# Patient Record
Sex: Male | Born: 1985 | Race: Asian | Hispanic: No | Marital: Single | Smoking: Current every day smoker
Health system: Southern US, Community
[De-identification: ages and names within clinical notes are randomized; demographics above are authoritative.]

---

## 2011-03-20 ENCOUNTER — Inpatient Hospital Stay (INDEPENDENT_AMBULATORY_CARE_PROVIDER_SITE_OTHER)
Admission: RE | Admit: 2011-03-20 | Discharge: 2011-03-20 | Disposition: A | Discharge status: Home / Self Care | Payer: Self-pay | Source: Ambulatory Visit | Attending: Family Medicine | Admitting: Family Medicine

## 2011-03-20 DIAGNOSIS — L259 Unspecified contact dermatitis, unspecified cause: Secondary | ICD-10-CM

## 2011-06-24 ENCOUNTER — Emergency Department (HOSPITAL_COMMUNITY)
Admission: EM | Admit: 2011-06-24 | Discharge: 2011-06-24 | Disposition: A | Discharge status: Home / Self Care | Payer: Self-pay | Source: Home / Self Care

## 2012-11-11 ENCOUNTER — Encounter (HOSPITAL_COMMUNITY): Payer: Self-pay | Admitting: Emergency Medicine

## 2012-11-11 DIAGNOSIS — R51 Headache: Secondary | ICD-10-CM | POA: Insufficient documentation

## 2012-11-11 DIAGNOSIS — J029 Acute pharyngitis, unspecified: Secondary | ICD-10-CM | POA: Insufficient documentation

## 2012-11-11 NOTE — ED Notes (Signed)
PT. REPORTS SORE THROAT , HEADACHE , LOW GRADE FEVER AND FEET " COLD" ONSET THIS MORNING .

## 2012-11-12 ENCOUNTER — Emergency Department (HOSPITAL_COMMUNITY)
Admission: EM | Admit: 2012-11-12 | Discharge: 2012-11-12 | Disposition: A | Discharge status: Home / Self Care | Payer: PRIVATE HEALTH INSURANCE | Attending: Emergency Medicine | Admitting: Emergency Medicine

## 2012-11-12 DIAGNOSIS — J029 Acute pharyngitis, unspecified: Secondary | ICD-10-CM

## 2012-11-12 LAB — RAPID STREP SCREEN (MED CTR MEBANE ONLY): Streptococcus, Group A Screen (Direct): NEGATIVE

## 2012-11-12 MED ORDER — IBUPROFEN 600 MG PO TABS: 600.0000 mg | ORAL_TABLET | Freq: Four times a day (QID) | ORAL | Status: AC | PRN

## 2012-11-12 MED ORDER — IBUPROFEN 400 MG PO TABS
600.0000 mg | ORAL_TABLET | Freq: Once | ORAL | Status: AC
  Administered 2012-11-12: 600 mg via ORAL
  Filled 2012-11-12: qty 1

## 2012-11-12 NOTE — ED Provider Notes (Signed)
Medical screening examination/treatment/procedure(s) were performed by non-physician practitioner and as supervising physician I was immediately available for consultation/collaboration.  Jasmine Awe, MD 11/12/12 [phone]

## 2012-11-12 NOTE — ED Provider Notes (Signed)
History     CSN: [id number]  Arrival date & time 11/11/12  2316   First MD Initiated Contact with Patient 11/12/12 0008      Chief Complaint  Patient presents with  . Sore Throat    (Consider location/radiation/quality/duration/timing/severity/associated sxs/prior treatment) HPI Comments: Sore throat since this morning has taken a native herb without relief, appeitite okay , drink water, denies nausea. Tonight developed a headache and "hot feet"   Patient is a 27 y.o. male presenting with pharyngitis. The history is provided by the patient.  Sore Throat This is a new problem. The current episode started today. The problem occurs constantly. The problem has been unchanged. Associated symptoms include headaches and a sore throat. Pertinent negatives include no abdominal pain, chills, congestion, coughing, fever, nausea, neck pain, rash, vomiting or weakness. The symptoms are aggravated by swallowing. He has tried nothing for the symptoms. The treatment provided no relief.    History reviewed. No pertinent past medical history.  History reviewed. No pertinent past surgical history.  No family history on file.  History  Substance Use Topics  . Smoking status: Never Smoker   . Smokeless tobacco: Not on file  . Alcohol Use: No      Review of Systems  Constitutional: Negative for fever and chills.  HENT: Positive for sore throat. Negative for ear pain, congestion, drooling, trouble swallowing, neck pain and dental problem.   Respiratory: Negative for cough.   Gastrointestinal: Negative for nausea, vomiting and abdominal pain.  Genitourinary: Negative for dysuria.  Skin: Negative for rash.  Neurological: Positive for headaches. Negative for dizziness and weakness.  All other systems reviewed and are negative.    Allergies  Review of patient's allergies indicates no known allergies.  Home Medications   Current Outpatient Rx  Name  Route  Sig  Dispense  Refill  .  ibuprofen (ADVIL,MOTRIN) 200 MG tablet   Oral   Take 200 mg by mouth every 6 (six) hours as needed for pain.         Marland Kitchen ibuprofen (ADVIL,MOTRIN) 600 MG tablet   Oral   Take 1 tablet (600 mg total) by mouth every 6 (six) hours as needed for pain.   30 tablet   0     BP 114/64  Pulse 83  Temp(Src) 98.5 F (36.9 C) (Oral)  Resp 20  SpO2 99%  Physical Exam  Nursing note and vitals reviewed. Constitutional: He is oriented to person, place, and time. He appears well-developed and well-nourished. No distress.  HENT:  Head: Normocephalic and atraumatic.  Right Ear: External ear normal.  Left Ear: External ear normal.  Mouth/Throat: Uvula is midline. Posterior oropharyngeal erythema present. No tonsillar abscesses.  Eyes: Pupils are equal, round, and reactive to light.  Neck: Normal range of motion.  Cardiovascular: Normal rate and regular rhythm.   Pulmonary/Chest: Effort normal and breath sounds normal.  Abdominal: Soft. He exhibits no distension.  Musculoskeletal: Normal range of motion.  Lymphadenopathy:    He has no cervical adenopathy.  Neurological: He is alert and oriented to person, place, and time.  Skin: Skin is warm. No rash noted. No erythema.    ED Course  Procedures (including critical care time)  Labs Reviewed  RAPID STREP SCREEN  CULTURE, GROUP A STREP   No results found.   1. Pharyngitis       MDM   Negative strep will treat with Ibuprofen         Arman Filter, NP 11/12/12 [phone]

## 2012-11-14 LAB — CULTURE, GROUP A STREP

## 2012-11-15 NOTE — Progress Notes (Signed)
  ED Antimicrobial Stewardship Positive Culture Follow Up   Luis Valencia is an 27 y.o. male who presented to [facility] on 11/12/2012 with a chief complaint of sore throat, HA, low grade fever.  Chief Complaint  Patient presents with  . Sore Throat    Recent Results (from the past 720 hour(s))  RAPID STREP SCREEN     Status: None   Collection Time    11/11/12 11:40 PM      Result Value Range Status   Streptococcus, Group A Screen (Direct) NEGATIVE  NEGATIVE Final   Comment: (NOTE)     A Rapid Antigen test may result negative if the antigen level in the     sample is below the detection level of this test. The FDA has not     cleared this test as a stand-alone test therefore the rapid antigen     negative result has reflexed to a Group A Strep culture.  CULTURE, GROUP A STREP     Status: None   Collection Time    11/11/12 11:40 PM      Result Value Range Status   Specimen Description THROAT   Final   Special Requests ADDED 0009 11/12/12   Final   Culture GROUP A STREP (S.PYOGENES) ISOLATED   Final   Report Status 11/14/2012 FINAL   Final    []  Treated with , organism resistant to prescribed antimicrobial [x]  Patient discharged originally without antimicrobial agent and treatment is now indicated  New antibiotic prescription: Amoxicillin 500mg  PO BID x 10 days  ED Provider: Sharilyn Sites, PA-C   [name] 11/15/2012, 10:06 AM Infectious Diseases Pharmacist Phone# [phone]

## 2012-11-15 NOTE — ED Notes (Signed)
Post ED Visit - Positive Culture Follow-up: Successful Patient Follow-Up  Culture assessed and recommendations reviewed by: [x]  Wes Dulaney, Pharm.D., BCPS []  Celedonio Miyamoto, Pharm.D., BCPS []  Georgina Pillion, [address].D., BCPS []  [place], [address].D., BCPS, AAHIVP []  Estella Husk, Pharm.D., BCPS, AAHIVP  Positive group a strep culture  [x]  Patient discharged without antimicrobial prescription and treatment is now indicated []  Organism is resistant to prescribed ED discharge antimicrobial []  Patient with positive blood cultures  Changes discussed with ED provider:Lisa Allyne Gee New antibiotic prescription Amoxicillin 500 mg po BID x 10 days Called to [phone] by Patty PFM  Contacted patient:Patient informed of positive results date 11/15/2012, time 1534   Larena Sox 11/15/2012, 3:31 PM

## 2013-03-05 ENCOUNTER — Encounter (HOSPITAL_COMMUNITY): Payer: Self-pay | Admitting: Emergency Medicine

## 2013-03-05 ENCOUNTER — Emergency Department (INDEPENDENT_AMBULATORY_CARE_PROVIDER_SITE_OTHER)
Admission: EM | Admit: 2013-03-05 | Discharge: 2013-03-05 | Disposition: A | Discharge status: Home / Self Care | Payer: PRIVATE HEALTH INSURANCE | Source: Home / Self Care | Attending: Family Medicine | Admitting: Family Medicine

## 2013-03-05 DIAGNOSIS — J069 Acute upper respiratory infection, unspecified: Secondary | ICD-10-CM

## 2013-03-05 MED ORDER — HYDROCOD POLST-CHLORPHEN POLST 10-8 MG/5ML PO LQCR: 5.0000 mL | Freq: Two times a day (BID) | ORAL | Status: AC | PRN

## 2013-03-05 MED ORDER — FLUTICASONE PROPIONATE 50 MCG/ACT NA SUSP: 2.0000 | Freq: Two times a day (BID) | NASAL | Status: AC

## 2013-03-05 MED ORDER — FEXOFENADINE HCL 180 MG PO TABS: 180.0000 mg | ORAL_TABLET | Freq: Every day | ORAL | Status: AC

## 2013-03-05 NOTE — ED Provider Notes (Signed)
CSN: [id number]     Arrival date & time 03/05/13  1828 History   First MD Initiated Contact with Patient 03/05/13 1953     Chief Complaint  Patient presents with  . URI   (Consider location/radiation/quality/duration/timing/severity/associated sxs/prior Treatment) Patient is a 27 y.o. male presenting with URI. The history is provided by the patient.  URI Presenting symptoms: congestion, cough and rhinorrhea   Presenting symptoms: no fever   Severity:  Mild Onset quality:  Gradual Duration:  2 days Progression:  Unchanged Chronicity:  New Associated symptoms: sneezing   Associated symptoms: no wheezing   Risk factors comment:  Smoker   History reviewed. No pertinent past medical history. History reviewed. No pertinent past surgical history. No family history on file. History  Substance Use Topics  . Smoking status: Current Every Day Smoker    Types: Cigarettes  . Smokeless tobacco: Not on file  . Alcohol Use: No    Review of Systems  Constitutional: Negative.  Negative for fever.  HENT: Positive for congestion, rhinorrhea and sneezing.   Respiratory: Positive for cough. Negative for wheezing.   Cardiovascular: Negative.   Gastrointestinal: Negative.     Allergies  Review of patient's allergies indicates no known allergies.  Home Medications   Current Outpatient Rx  Name  Route  Sig  Dispense  Refill  . chlorpheniramine-HYDROcodone (TUSSIONEX PENNKINETIC ER) 10-8 MG/5ML LQCR   Oral   Take 5 mLs by mouth every 12 (twelve) hours as needed.   115 mL   0   . fexofenadine (ALLEGRA) 180 MG tablet   Oral   Take 1 tablet (180 mg total) by mouth daily.   30 tablet   1   . fluticasone (FLONASE) 50 MCG/ACT nasal spray   Nasal   Place 2 sprays into the nose 2 (two) times daily.   1 g   2   . ibuprofen (ADVIL,MOTRIN) 200 MG tablet   Oral   Take 200 mg by mouth every 6 (six) hours as needed for pain.         Marland Kitchen ibuprofen (ADVIL,MOTRIN) 600 MG tablet   Oral  Take 1 tablet (600 mg total) by mouth every 6 (six) hours as needed for pain.   30 tablet   0    BP 125/78  Pulse 92  Temp(Src) 98.8 F (37.1 C) (Oral)  Resp 18  SpO2 100% Physical Exam  Nursing note and vitals reviewed. Constitutional: He is oriented to person, place, and time. He appears well-developed and well-nourished.  HENT:  Head: Normocephalic.  Right Ear: External ear normal.  Left Ear: External ear normal.  Mouth/Throat: Oropharynx is clear and moist.  Eyes: Conjunctivae are normal. Pupils are equal, round, and reactive to light.  Neck: Normal range of motion. Neck supple.  Cardiovascular: Normal rate, regular rhythm, normal heart sounds and intact distal pulses.   Pulmonary/Chest: Effort normal and breath sounds normal.  Lymphadenopathy:    He has no cervical adenopathy.  Neurological: He is alert and oriented to person, place, and time.  Skin: Skin is warm and dry.    ED Course  Procedures (including critical care time) Labs Review Labs Reviewed - No data to display Imaging Review No results found.  MDM      Linna Hoff, MD 03/05/13 2041

## 2013-03-05 NOTE — ED Notes (Addendum)
Pt c/o cold sxs onset 2 days Sxs include: fever, sneezing, productive cough, chest pain due to cough, runny nose.  Denies: v/d, SOB, wheezing Taking ibup w/no relief.  Smokes 3-4 cig PD He is alert w/no signs of acute distress.

## 2016-08-12 ENCOUNTER — Emergency Department (HOSPITAL_COMMUNITY)
Admission: EM | Admit: 2016-08-12 | Discharge: 2016-08-12 | Disposition: A | Discharge status: Home / Self Care | Payer: 59 | Attending: Emergency Medicine | Admitting: Emergency Medicine

## 2016-08-12 ENCOUNTER — Encounter (HOSPITAL_COMMUNITY): Payer: Self-pay | Admitting: Emergency Medicine

## 2016-08-12 DIAGNOSIS — F17210 Nicotine dependence, cigarettes, uncomplicated: Secondary | ICD-10-CM | POA: Insufficient documentation

## 2016-08-12 DIAGNOSIS — B078 Other viral warts: Secondary | ICD-10-CM | POA: Insufficient documentation

## 2016-08-12 DIAGNOSIS — M7989 Other specified soft tissue disorders: Secondary | ICD-10-CM | POA: Diagnosis present

## 2016-08-12 MED ORDER — SILVER SULFADIAZINE 1 % EX CREA
TOPICAL_CREAM | Freq: Two times a day (BID) | CUTANEOUS | Status: DC
  Administered 2016-08-12: 07:00:00 via TOPICAL
  Filled 2016-08-12: qty 85

## 2016-08-12 MED ORDER — SILVER NITRATE-POT NITRATE 75-25 % EX MISC
4.0000 | Freq: Once | CUTANEOUS | Status: AC
  Administered 2016-08-12: 4 via TOPICAL
  Filled 2016-08-12: qty 4

## 2016-08-12 MED ORDER — LIDOCAINE HCL 2 % IJ SOLN
10.0000 mL | Freq: Once | INTRAMUSCULAR | Status: AC
  Administered 2016-08-12: 200 mg
  Filled 2016-08-12: qty 20

## 2016-08-12 NOTE — ED Notes (Signed)
Pt stable, ambulatory, states understanding of discharge instructions 

## 2016-08-12 NOTE — ED Triage Notes (Signed)
Patient with dime size open sore on left thumb stalk.  Patient states that it is itchy, no pain.  Patient got some OTC salve, it has now swelled and peely, dry skin to the area, cracked.  Patient is Nepali, speaks minimal english.

## 2016-08-12 NOTE — ED Provider Notes (Addendum)
[facility] Provider Note   CSN: [id number] Arrival date & time: 08/12/16  0510     History   Chief Complaint Chief Complaint  Patient presents with  . Finger Injury    HPI Luis Valencia is a 31 y.o. male.  Patient presents to the ER for evaluation of pain and injury of the left thumb. Patient reports that it started with a small sore on the phone, he tried to open it up himself in the area has become more swollen and larger.      History reviewed. No pertinent past medical history.  There are no active problems to display for this patient.   History reviewed. No pertinent surgical history.     Home Medications    Prior to Admission medications   Medication Sig Start Date End Date Taking? Authorizing Provider  chlorpheniramine-HYDROcodone (TUSSIONEX PENNKINETIC ER) 10-8 MG/5ML LQCR Take 5 mLs by mouth every 12 (twelve) hours as needed. 03/05/13   Linna HoffJames D Kindl, MD  fexofenadine (ALLEGRA) 180 MG tablet Take 1 tablet (180 mg total) by mouth daily. 03/05/13   Linna HoffJames D Kindl, MD  fluticasone (FLONASE) 50 MCG/ACT nasal spray Place 2 sprays into the nose 2 (two) times daily. 03/05/13   Linna HoffJames D Kindl, MD  ibuprofen (ADVIL,MOTRIN) 200 MG tablet Take 200 mg by mouth every 6 (six) hours as needed for pain.    Historical Provider, MD  ibuprofen (ADVIL,MOTRIN) 600 MG tablet Take 1 tablet (600 mg total) by mouth every 6 (six) hours as needed for pain. 11/12/12   Earley FavorGail Schulz, NP    Family History No family history on file.  Social History Social History  Substance Use Topics  . Smoking status: Current Every Day Smoker    Types: Cigarettes  . Smokeless tobacco: Never Used  . Alcohol use No     Allergies   Patient has no known allergies.   Review of Systems Review of Systems  Skin: Positive for wound.     Physical Exam Updated Vital Signs BP 126/79   Pulse 69   Temp 98.7 F (37.1 C) (Oral)   Resp 19   SpO2 99%   Physical Exam  Musculoskeletal:  Normal range of motion. He exhibits no deformity.  Skin:  1cm raised, tender, cracked lesion volar aspect of proximal phalynx left thumb     ED Treatments / Results  Labs (all labs ordered are listed, but only abnormal results are displayed) Labs Reviewed - No data to display  EKG  EKG Interpretation None       Radiology No results found.  Procedures .Marland Kitchen.Incision and Drainage Date/Time: 08/12/2016 6:28 AM Performed by: Gilda CreasePOLLINA, Tasheba Henson J Authorized by: Gilda CreasePOLLINA, Rufus Beske J   Consent:    Consent obtained:  Verbal   Consent given by:  Patient   Risks discussed:  Bleeding, incomplete drainage and pain Universal protocol:    Procedure explained and questions answered to patient or proxy's satisfaction: yes     Site/side marked: yes     Immediately prior to procedure a time out was called: yes     Patient identity confirmed:  Verbally with patient Location:    Type:  Abscess   Location:  Upper extremity   Upper extremity location:  Finger   Finger location:  L thumb Pre-procedure details:    Skin preparation:  Betadine Anesthesia (see MAR for exact dosages):    Anesthesia method:  Nerve block   Block location:  Digital   Block needle gauge:  27 G  Block anesthetic:  Lidocaine 2% w/o epi   Block technique:  Standard digitial   Block injection procedure:  Anatomic landmarks identified, introduced needle, incremental injection, anatomic landmarks palpated and negative aspiration for blood   Block outcome:  Anesthesia achieved Procedure type:    Complexity:  Simple Procedure details:    Incision type: Initial stab incision made, no pus encountered.. Entire area of callus was then unroofed with a combination of scalpel and scissors. Post-procedure details:    Patient tolerance of procedure:  Tolerated well, no immediate complications    (including critical care time)  Medications Ordered in ED Medications  lidocaine (XYLOCAINE) 2 % (with pres) injection 200 mg  (200 mg Infiltration Given 08/12/16 0606)  silver nitrate applicators applicator 4 Stick (4 Sticks Topical Given 08/12/16 0647)     Initial Impression / Assessment and Plan / ED Course  I have reviewed the triage vital signs and the nursing notes.  Pertinent labs & imaging results that were available during my care of the patient were reviewed by me and considered in my medical decision making (see chart for details).     Patient presented with a tender slightly fluctuant lesion on his thumb. He is a Curator, works with his hands daily. He's not sure if he had a direct injury to the site. He did report trying to cut it open himself, this raises suspicion for possible abscess. The area was unroofed after digital block was performed. There was no evidence of pus or infection. Tissue underneath resembled either granuloma or possible wart. Area was sharply dissected and then cauterized with silver nitrate.  Final Clinical Impressions(s) / ED Diagnoses   Final diagnoses:  Other viral warts    New Prescriptions New Prescriptions   No medications on file     Gilda Crease, MD 08/12/16 1610    Gilda Crease, MD 08/12/16 [phone]

## 2017-04-30 ENCOUNTER — Ambulatory Visit (HOSPITAL_COMMUNITY)
Admission: EM | Admit: 2017-04-30 | Discharge: 2017-04-30 | Disposition: A | Discharge status: Nursing Home (Includes ICF) | Payer: 59 | Source: Home / Self Care

## 2017-04-30 ENCOUNTER — Encounter (HOSPITAL_COMMUNITY): Payer: Self-pay

## 2017-04-30 ENCOUNTER — Other Ambulatory Visit: Payer: Self-pay

## 2017-04-30 ENCOUNTER — Encounter (HOSPITAL_COMMUNITY): Payer: Self-pay | Admitting: Emergency Medicine

## 2017-04-30 DIAGNOSIS — R740 Nonspecific elevation of levels of transaminase and lactic acid dehydrogenase [LDH]: Secondary | ICD-10-CM | POA: Insufficient documentation

## 2017-04-30 DIAGNOSIS — R198 Other specified symptoms and signs involving the digestive system and abdomen: Secondary | ICD-10-CM

## 2017-04-30 DIAGNOSIS — F17210 Nicotine dependence, cigarettes, uncomplicated: Secondary | ICD-10-CM | POA: Diagnosis not present

## 2017-04-30 DIAGNOSIS — Z79899 Other long term (current) drug therapy: Secondary | ICD-10-CM | POA: Diagnosis not present

## 2017-04-30 DIAGNOSIS — R1011 Right upper quadrant pain: Secondary | ICD-10-CM | POA: Diagnosis not present

## 2017-04-30 DIAGNOSIS — R109 Unspecified abdominal pain: Secondary | ICD-10-CM | POA: Diagnosis present

## 2017-04-30 LAB — COMPREHENSIVE METABOLIC PANEL
ALK PHOS: 77 U/L (ref 38–126)
ALT: 67 U/L — AB (ref 17–63)
AST: 54 U/L — AB (ref 15–41)
Albumin: 4.9 g/dL (ref 3.5–5.0)
Anion gap: 10 (ref 5–15)
BILIRUBIN TOTAL: 0.7 mg/dL (ref 0.3–1.2)
BUN: 12 mg/dL (ref 6–20)
CALCIUM: 9.9 mg/dL (ref 8.9–10.3)
CO2: 21 mmol/L — ABNORMAL LOW (ref 22–32)
Chloride: 106 mmol/L (ref 101–111)
Creatinine, Ser: 0.96 mg/dL (ref 0.61–1.24)
GFR calc Af Amer: >60 mL/min (ref >60)
GFR calc non Af Amer: >60 mL/min (ref >60)
GLUCOSE: 87 mg/dL (ref 65–99)
Potassium: 4 mmol/L (ref 3.5–5.1)
Sodium: 137 mmol/L (ref 135–145)
TOTAL PROTEIN: 8.3 g/dL — AB (ref 6.5–8.1)

## 2017-04-30 LAB — POCT URINALYSIS DIP (DEVICE)
BILIRUBIN URINE: NEGATIVE
Glucose, UA: NEGATIVE mg/dL
HGB URINE DIPSTICK: NEGATIVE
Ketones, ur: NEGATIVE mg/dL
LEUKOCYTES UA: NEGATIVE
Nitrite: NEGATIVE
Protein, ur: NEGATIVE mg/dL
SPECIFIC GRAVITY, URINE: 1.015 (ref 1.005–1.030)
Urobilinogen, UA: 1 mg/dL (ref 0.0–1.0)
pH: 8.5 — ABNORMAL HIGH (ref 5.0–8.0)

## 2017-04-30 LAB — CBC
HCT: 49.8 % (ref 39.0–52.0)
Hemoglobin: 17.5 g/dL — ABNORMAL HIGH (ref 13.0–17.0)
MCH: 29.4 pg (ref 26.0–34.0)
MCHC: 35.1 g/dL (ref 30.0–36.0)
MCV: 83.7 fL (ref 78.0–100.0)
PLATELETS: 244 10*3/uL (ref 150–400)
RBC: 5.95 MIL/uL — ABNORMAL HIGH (ref 4.22–5.81)
RDW: 12.9 % (ref 11.5–15.5)
WBC: 8.3 10*3/uL (ref 4.0–10.5)

## 2017-04-30 LAB — LIPASE, BLOOD: Lipase: 38 U/L (ref 11–51)

## 2017-04-30 NOTE — ED Triage Notes (Signed)
Pt endorses RUQ, LUQ pain radiating to left lower pelvic area bilateral flank x 4 days. [facility].

## 2017-04-30 NOTE — ED Triage Notes (Addendum)
General abdominal pain, pain starts in right abdomen, radiates to left abdomen and then groin.  No urinary issues.  Last bm was this morning, normal per patient, no vomiting or diarrhea, patient also has low back pain

## 2017-04-30 NOTE — ED Provider Notes (Signed)
[facility]    CSN: [id number] Arrival date & time: 04/30/17  1723     History   Chief Complaint Chief Complaint  Patient presents with  . Abdominal Pain    HPI Kayren EavesDharmananda Sanyasi is a 31 y.o. male.   31 year-old male, presenting today complaining of abdominal pain. Patient states that he has had 4 days of right sided abdominal pain that radiates around to the right flank. Denies nausea, vomiting, fever or chills. Denies any change in pain associated with eating or drinking  No previous abdominal surgeries    The history is provided by the patient.  Abdominal Pain  Pain location:  RUQ Pain quality: aching   Pain radiates to:  R flank Pain severity:  Moderate Onset quality:  Gradual Duration:  4 days Timing:  Constant Progression:  Unchanged Chronicity:  New Context: not alcohol use, not awakening from sleep, not diet changes, not eating and not laxative use   Relieved by:  Nothing Worsened by:  Nothing Ineffective treatments:  Lying down Associated symptoms: no chest pain, no chills, no constipation, no cough, no diarrhea, no dysuria, no fever, no hematuria, no nausea, no shortness of breath, no sore throat and no vomiting   Risk factors: no alcohol abuse, no aspirin use, not elderly, has not had multiple surgeries, no NSAID use and not obese     History reviewed. No pertinent past medical history.  There are no active problems to display for this patient.   History reviewed. No pertinent surgical history.     Home Medications    Prior to Admission medications   Medication Sig Start Date End Date Taking? Authorizing Provider  chlorpheniramine-HYDROcodone (TUSSIONEX PENNKINETIC ER) 10-8 MG/5ML LQCR Take 5 mLs by mouth every 12 (twelve) hours as needed. 03/05/13   Linna HoffKindl, James D, MD  fexofenadine (ALLEGRA) 180 MG tablet Take 1 tablet (180 mg total) by mouth daily. 03/05/13   Linna HoffKindl, James D, MD  fluticasone (FLONASE) 50 MCG/ACT nasal spray Place 2  sprays into the nose 2 (two) times daily. 03/05/13   Linna HoffKindl, James D, MD  ibuprofen (ADVIL,MOTRIN) 200 MG tablet Take 200 mg by mouth every 6 (six) hours as needed for pain.    [provider]  ibuprofen (ADVIL,MOTRIN) 600 MG tablet Take 1 tablet (600 mg total) by mouth every 6 (six) hours as needed for pain. 11/12/12   Earley FavorSchulz, Gail, NP    Family History No family history on file.  Social History Social History   Tobacco Use  . Smoking status: Current Every Day Smoker    Types: Cigarettes  . Smokeless tobacco: Never Used  Substance Use Topics  . Alcohol use: No  . Drug use: No     Allergies   Patient has no known allergies.   Review of Systems Review of Systems  Constitutional: Negative for chills and fever.  HENT: Negative for ear pain and sore throat.   Eyes: Negative for pain and visual disturbance.  Respiratory: Negative for cough and shortness of breath.   Cardiovascular: Negative for chest pain and palpitations.  Gastrointestinal: Positive for abdominal pain. Negative for constipation, diarrhea, nausea and vomiting.  Genitourinary: Negative for dysuria and hematuria.  Musculoskeletal: Negative for arthralgias and back pain.  Skin: Negative for color change and rash.  Neurological: Negative for seizures and syncope.  All other systems reviewed and are negative.    Physical Exam Triage Vital Signs ED Triage Vitals  Enc Vitals Group     BP 04/30/17  1738 (!) 140/96     Pulse Rate 04/30/17 1738 90     Resp --      Temp 04/30/17 1738 98.3 F (36.8 C)     Temp Source 04/30/17 1738 Oral     SpO2 04/30/17 1738 98 %     Weight --      Height --      Head Circumference --      Peak Flow --      Pain Score 04/30/17 1736 9     Pain Loc --      Pain Edu? --      Excl. in GC? --    No data found.  Updated Vital Signs BP (!) 140/96 (BP Location: Left Arm)   Pulse 90   Temp 98.3 F (36.8 C) (Oral)   SpO2 98%   Visual Acuity Right Eye Distance:   Left  Eye Distance:   Bilateral Distance:    Right Eye Near:   Left Eye Near:    Bilateral Near:     Physical Exam  Constitutional: He appears well-developed and well-nourished.  HENT:  Head: Normocephalic and atraumatic.  Eyes: Conjunctivae are normal.  Neck: Neck supple.  Cardiovascular: Normal rate and regular rhythm.  No murmur heard. Pulmonary/Chest: Effort normal and breath sounds normal. No respiratory distress.  Abdominal: Soft. There is tenderness in the right upper quadrant. There is positive Murphy's sign.  Musculoskeletal: He exhibits no edema.  Neurological: He is alert.  Skin: Skin is warm and dry.  Psychiatric: He has a normal mood and affect.  Nursing note and vitals reviewed.    UC Treatments / Results  Labs (all labs ordered are listed, but only abnormal results are displayed) Labs Reviewed - No data to display  EKG  EKG Interpretation None       Radiology No results found.  Procedures Procedures (including critical care time)  Medications Ordered in UC Medications - No data to display   Initial Impression / Assessment and Plan / UC Course  I have reviewed the triage vital signs and the nursing notes.  Pertinent labs & imaging results that were available during my care of the patient were reviewed by me and considered in my medical decision making (see chart for details).     Right sided abdominal pain with radiation to the right flank RUQ tenderness with positive Murphy's sign Patient going to the Er for further eval to rule out possible cholecystitis  Final Clinical Impressions(s) / UC Diagnoses   Final diagnoses:  RUQ pain  Positive Murphy's Sign    ED Discharge Orders    None       Controlled Substance Prescriptions [place] Controlled Substance Registry consulted? Not Applicable   Alecia LemmingBlue, Manon Banbury C, [place]PA-C 04/30/17 [id number]

## 2017-05-01 ENCOUNTER — Emergency Department (HOSPITAL_COMMUNITY)
Admission: EM | Admit: 2017-05-01 | Discharge: 2017-05-01 | Disposition: A | Discharge status: Home / Self Care | Payer: 59 | Attending: Emergency Medicine | Admitting: Emergency Medicine

## 2017-05-01 ENCOUNTER — Emergency Department (HOSPITAL_COMMUNITY): Payer: 59

## 2017-05-01 DIAGNOSIS — R109 Unspecified abdominal pain: Secondary | ICD-10-CM

## 2017-05-01 DIAGNOSIS — R7401 Elevation of levels of liver transaminase levels: Secondary | ICD-10-CM

## 2017-05-01 DIAGNOSIS — R740 Nonspecific elevation of levels of transaminase and lactic acid dehydrogenase [LDH]: Secondary | ICD-10-CM

## 2017-05-01 NOTE — ED Provider Notes (Signed)
[facility] EMERGENCY DEPARTMENT Provider Note   CSN: [id number] Arrival date & time: 04/30/17  1816     History   Chief Complaint Chief Complaint  Patient presents with  . Abdominal Pain    HPI Luis Valencia is a 31 y.o. male.  The history is provided by the patient.  Abdominal Pain    He comes in with a one-week history of pain which started in the right upper abdomen but is now also present in the left lower abdomen, mid back, and left lateral thigh.  Pain is getting worse and he rates it at 8/10.  It is tender to palpation, but nothing else makes it worse and nothing makes it better.  He denies fever, chills, sweats.  He denies nausea or vomiting or diarrhea.  He has not tried to take anything for this.  He went to urgent care and was referred to emergency department for further evaluation.  History reviewed. No pertinent past medical history.  There are no active problems to display for this patient.   History reviewed. No pertinent surgical history.     Home Medications    Prior to Admission medications   Medication Sig Start Date End Date Taking? Authorizing Provider  chlorpheniramine-HYDROcodone (TUSSIONEX PENNKINETIC ER) 10-8 MG/5ML LQCR Take 5 mLs by mouth every 12 (twelve) hours as needed. 03/05/13   Linna HoffKindl, James D, MD  fexofenadine (ALLEGRA) 180 MG tablet Take 1 tablet (180 mg total) by mouth daily. 03/05/13   Linna HoffKindl, James D, MD  fluticasone (FLONASE) 50 MCG/ACT nasal spray Place 2 sprays into the nose 2 (two) times daily. 03/05/13   Linna HoffKindl, James D, MD  ibuprofen (ADVIL,MOTRIN) 200 MG tablet Take 200 mg by mouth every 6 (six) hours as needed for pain.    [provider]  ibuprofen (ADVIL,MOTRIN) 600 MG tablet Take 1 tablet (600 mg total) by mouth every 6 (six) hours as needed for pain. 11/12/12   Earley FavorSchulz, Gail, NP    Family History History reviewed. No pertinent family history.  Social History Social History   Tobacco Use  .  Smoking status: Current Every Day Smoker    Types: Cigarettes  . Smokeless tobacco: Never Used  Substance Use Topics  . Alcohol use: Yes    Comment: on weekends  . Drug use: No     Allergies   Patient has no known allergies.   Review of Systems Review of Systems  Gastrointestinal: Positive for abdominal pain.  All other systems reviewed and are negative.    Physical Exam Updated Vital Signs BP (!) 122/48 (BP Location: Left Arm)   Pulse 62   Temp 97.7 F (36.5 C) (Oral)   Resp 18   Ht 5\' 5"  (1.651 m)   Wt 78.9 kg (174 lb)   SpO2 100%   BMI 28.96 kg/m   Physical Exam  Nursing note and vitals reviewed.  31 year old male, resting comfortably and in no acute distress. Vital signs are normal. Oxygen saturation is 100%, which is normal. Head is normocephalic and atraumatic. PERRLA, EOMI. Oropharynx is clear. Neck is nontender and supple without adenopathy or JVD. Back is nontender and there is no CVA tenderness. Lungs are clear without rales, wheezes, or rhonchi. Chest has mild tenderness in the right lower costal margin. Heart has regular rate and rhythm without murmur. Abdomen is soft, flat, with mild tenderness in the right upper abdomen and left lower abdomen.  There is no rebound or guarding.  There are no  masses or hepatosplenomegaly and peristalsis is normoactive. Extremities have no cyanosis or edema, full range of motion is present. Skin is warm and dry without rash. Neurologic: Mental status is normal, cranial nerves are intact, there are no motor or sensory deficits.  ED Treatments / Results  Labs (all labs ordered are listed, but only abnormal results are displayed) Labs Reviewed  COMPREHENSIVE METABOLIC PANEL - Abnormal; Notable for the following components:      Result Value   CO2 21 (*)    Total Protein 8.3 (*)    AST 54 (*)    ALT 67 (*)    All other components within normal limits  CBC - Abnormal; Notable for the following components:   RBC 5.95  (*)    Hemoglobin 17.5 (*)    All other components within normal limits  LIPASE, BLOOD   Radiology Ct Renal Stone Study  Result Date: 05/01/2017 CLINICAL DATA:  Lower right abdominal pain EXAM: CT ABDOMEN AND PELVIS WITHOUT CONTRAST TECHNIQUE: Multidetector CT imaging of the abdomen and pelvis was performed following the standard protocol without IV contrast. COMPARISON:  None. FINDINGS: Lower chest: No pulmonary nodules or pleural effusion. No visible pericardial effusion. Hepatobiliary: Normal hepatic contours and density. No visible biliary dilatation. Normal gallbladder. Pancreas: Normal contours without ductal dilatation. No peripancreatic fluid collection. Spleen: Normal. Adrenals/Urinary Tract: --Adrenal glands: Normal. --Right kidney/ureter: No hydronephrosis or perinephric stranding. No nephrolithiasis. No obstructing ureteral stones. --Left kidney/ureter: No hydronephrosis or perinephric stranding. No nephrolithiasis. No obstructing ureteral stones. --Urinary bladder: Unremarkable. Stomach/Bowel: --Stomach/Duodenum: No hiatal hernia or other gastric abnormality. Normal duodenal course and caliber. --Small bowel: No dilatation or inflammation. --Colon: No focal abnormality. --Appendix: Normal. Vascular/Lymphatic: Normal course and caliber of the major abdominal vessels. No abdominal or pelvic lymphadenopathy. Reproductive: Normal prostate and seminal vesicles. Musculoskeletal. No bony spinal canal stenosis or focal osseous abnormality. Other: None. IMPRESSION: No acute abnormality of the abdomen or pelvis. Electronically Signed   By: Deatra RobinsonKevin  Herman M.D.   On: 05/01/2017 06:06    Procedures Procedures (including critical care time)  Medications Ordered in ED Medications - No data to display   Initial Impression / Assessment and Plan / ED Course  I have reviewed the triage vital signs and the nursing notes.  Pertinent labs & imaging results that were available during my care of the  patient were reviewed by me and considered in my medical decision making (see chart for details).  Abdominal pain and pattern the really is not making any anatomic sense.  Exam is benign and I doubt serious pathology.  However, given the severity of his pain, will send for CT scan to make sure no serious pathology present.  Old records are reviewed, confirming urgent care visit earlier today but no other relevant past visits.  CT is unremarkable.  Patient is given reassurance that there is no evidence of any serious pathology.  Mild elevation of transaminases is not felt to be clinically significant and does not require any additional investigation at this time. He is discharged with instructions to take OTC NSAID's as needed. Return precautions discussed.  Final Clinical Impressions(s) / ED Diagnoses   Final diagnoses:  Abdominal pain, unspecified abdominal location  Elevated transaminase level    ED Discharge Orders    None       Dione BoozeGlick, Genelle Economou, MD 05/01/17 [phone]

## 2017-05-01 NOTE — Discharge Instructions (Signed)
Take ibuprofen or naproxen as needed for pain. Return if symptoms are getting worse. °

## 2017-05-01 NOTE — ED Notes (Signed)
Pt verbalized understanding of d/c instructions and has no further questions. Pt is stable, A&Ox4, VSS.  

## 2018-08-05 ENCOUNTER — Other Ambulatory Visit: Payer: Self-pay

## 2018-08-05 ENCOUNTER — Emergency Department (HOSPITAL_COMMUNITY): Payer: BLUE CROSS/BLUE SHIELD

## 2018-08-05 ENCOUNTER — Emergency Department (HOSPITAL_COMMUNITY)
Admission: EM | Admit: 2018-08-05 | Discharge: 2018-08-05 | Disposition: A | Discharge status: Home / Self Care | Payer: BLUE CROSS/BLUE SHIELD | Attending: Emergency Medicine | Admitting: Emergency Medicine

## 2018-08-05 ENCOUNTER — Encounter (HOSPITAL_COMMUNITY): Payer: Self-pay

## 2018-08-05 DIAGNOSIS — S39012A Strain of muscle, fascia and tendon of lower back, initial encounter: Secondary | ICD-10-CM

## 2018-08-05 DIAGNOSIS — Y929 Unspecified place or not applicable: Secondary | ICD-10-CM | POA: Diagnosis not present

## 2018-08-05 DIAGNOSIS — Z79899 Other long term (current) drug therapy: Secondary | ICD-10-CM | POA: Insufficient documentation

## 2018-08-05 DIAGNOSIS — X58XXXA Exposure to other specified factors, initial encounter: Secondary | ICD-10-CM | POA: Insufficient documentation

## 2018-08-05 DIAGNOSIS — Y939 Activity, unspecified: Secondary | ICD-10-CM | POA: Diagnosis not present

## 2018-08-05 DIAGNOSIS — F17210 Nicotine dependence, cigarettes, uncomplicated: Secondary | ICD-10-CM | POA: Diagnosis not present

## 2018-08-05 DIAGNOSIS — Y999 Unspecified external cause status: Secondary | ICD-10-CM | POA: Diagnosis not present

## 2018-08-05 MED ORDER — KETOROLAC TROMETHAMINE 60 MG/2ML IM SOLN
60.0000 mg | Freq: Once | INTRAMUSCULAR | Status: AC
  Administered 2018-08-05: 60 mg via INTRAMUSCULAR
  Filled 2018-08-05: qty 2

## 2018-08-05 MED ORDER — TRAMADOL HCL 50 MG PO TABS: 50.0000 mg | ORAL_TABLET | Freq: Four times a day (QID) | ORAL | 0 refills | Status: AC | PRN

## 2018-08-05 MED ORDER — PREDNISONE 50 MG PO TABS: 50.0000 mg | ORAL_TABLET | Freq: Every day | ORAL | 0 refills | Status: AC

## 2018-08-05 MED ORDER — CYCLOBENZAPRINE HCL 10 MG PO TABS: 10.0000 mg | ORAL_TABLET | Freq: Three times a day (TID) | ORAL | 0 refills | Status: AC | PRN

## 2018-08-05 NOTE — Discharge Instructions (Signed)
Return here as needed.  Your x-rays did not show any abnormalities at this time.  Use ice and heat on your lower back avoid any heavy lifting until your back starts to improve.

## 2018-08-05 NOTE — ED Notes (Signed)
Patient verbalized understanding of discharge instructions and denies any further needs or questions at this time. VS stable. Patient ambulatory with steady gait.  

## 2018-08-05 NOTE — ED Triage Notes (Signed)
Pt with c/o lower back pain that started yesterday while cleaning car. Pt denies any previous back injury

## 2018-08-05 NOTE — ED Provider Notes (Signed)
[facility] EMERGENCY DEPARTMENT Provider Note   CSN: [id number] Arrival date & time: 08/05/18  0604    History   Chief Complaint Chief Complaint  Patient presents with  . Back Pain    HPI Luis Valencia is a 33 y.o. male.   Patient presents to the emergency department with left lower back pain that started yesterday after cleaning out his car.  The patient states that he has pain that increases with movement and standing.  Patient states that he is able to walk but does have increasing pain with this.  Patient states he does not have any known trauma other than working in his car.  Patient states that he does not have any numbness, weakness, incontinence, dysuria, fever, abdominal pain, nausea, vomiting or syncope.  Patient states he did not take any medications prior to arrival for his symptoms.  HPI  History reviewed. No pertinent past medical history.  There are no active problems to display for this patient.   History reviewed. No pertinent surgical history.      Home Medications    Prior to Admission medications   Medication Sig Start Date End Date Taking? Authorizing Provider  chlorpheniramine-HYDROcodone (TUSSIONEX PENNKINETIC ER) 10-8 MG/5ML LQCR Take 5 mLs by mouth every 12 (twelve) hours as needed. 03/05/13   Linna Hoff, MD  fexofenadine (ALLEGRA) 180 MG tablet Take 1 tablet (180 mg total) by mouth daily. 03/05/13   Linna Hoff, MD  fluticasone (FLONASE) 50 MCG/ACT nasal spray Place 2 sprays into the nose 2 (two) times daily. 03/05/13   Linna Hoff, MD  ibuprofen (ADVIL,MOTRIN) 200 MG tablet Take 200 mg by mouth every 6 (six) hours as needed for pain.    [provider]  ibuprofen (ADVIL,MOTRIN) 600 MG tablet Take 1 tablet (600 mg total) by mouth every 6 (six) hours as needed for pain. 11/12/12   Earley Favor, NP    Family History History reviewed. No pertinent family history.  Social History Social History   Tobacco Use  .  Smoking status: Current Every Day Smoker    Types: Cigarettes  . Smokeless tobacco: Never Used  Substance Use Topics  . Alcohol use: Yes    Comment: on weekends  . Drug use: No     Allergies   Patient has no known allergies.   Review of Systems Review of Systems  All other systems negative except as documented in the HPI. All pertinent positives and negatives as reviewed in the HPI. Physical Exam Updated Vital Signs BP 126/90 (BP Location: Right Arm)   Pulse (!) 103   Temp 98.7 F (37.1 C) (Oral)   Resp 16   SpO2 99%   Physical Exam Vitals signs and nursing note reviewed.  Constitutional:      General: He is not in acute distress.    Appearance: He is well-developed.  HENT:     Head: Normocephalic and atraumatic.  Eyes:     Pupils: Pupils are equal, round, and reactive to light.  Cardiovascular:     Rate and Rhythm: Normal rate and regular rhythm.  Pulmonary:     Effort: Pulmonary effort is normal.     Breath sounds: Normal breath sounds.  Musculoskeletal:     Lumbar back: He exhibits tenderness, pain and spasm. He exhibits normal range of motion, no bony tenderness and no swelling.  Skin:    General: Skin is warm and dry.  Neurological:     Mental Status: He is alert  and oriented to person, place, and time.     Sensory: No sensory deficit.     Motor: No weakness.     Coordination: Coordination normal.     Gait: Gait normal.     Deep Tendon Reflexes: Reflexes normal.      ED Treatments / Results  Labs (all labs ordered are listed, but only abnormal results are displayed) Labs Reviewed - No data to display  EKG None  Radiology Dg Lumbar Spine Complete  Result Date: 08/05/2018 CLINICAL DATA:  Lower back pain since yesterday EXAM: LUMBAR SPINE - COMPLETE 4+ VIEW COMPARISON:  None. FINDINGS: There is no evidence of lumbar spine fracture. Alignment is normal. Intervertebral disc spaces are maintained. IMPRESSION: Negative. Electronically Signed   By:  Marnee Spring M.D.   On: 08/05/2018 07:06    Procedures Procedures (including critical care time)  Medications Ordered in ED Medications  ketorolac (TORADOL) injection 60 mg (has no administration in time range)     Initial Impression / Assessment and Plan / ED Course  I have reviewed the triage vital signs and the nursing notes.  Pertinent labs & imaging results that were available during my care of the patient were reviewed by me and considered in my medical decision making (see chart for details).        Patient be treated for lumbar strain at this time.  I have advised him to return here for any worsening his condition.  Also advised to follow-up with primary doctor or urgent care for any further evaluation.  Patient has no neurological deficits noted on exam.  He does have normal reflexes and sensation in the lower extremities.  He also has normal strength in his lower extremities. Final Clinical Impressions(s) / ED Diagnoses   Final diagnoses:  None    ED Discharge Orders    None       Charlestine Night, PA-C 08/05/18 0719    Melene Plan, DO 08/05/18 [phone]

## 2019-11-23 IMAGING — DX DG LUMBAR SPINE COMPLETE 4+V
5 series · 5 of 5 positions shown · non-contrast
Comparison: None.

CLINICAL DATA: Lower back pain since yesterday

EXAM:
LUMBAR SPINE - COMPLETE 4+ VIEW

[t lumbar spine ap]
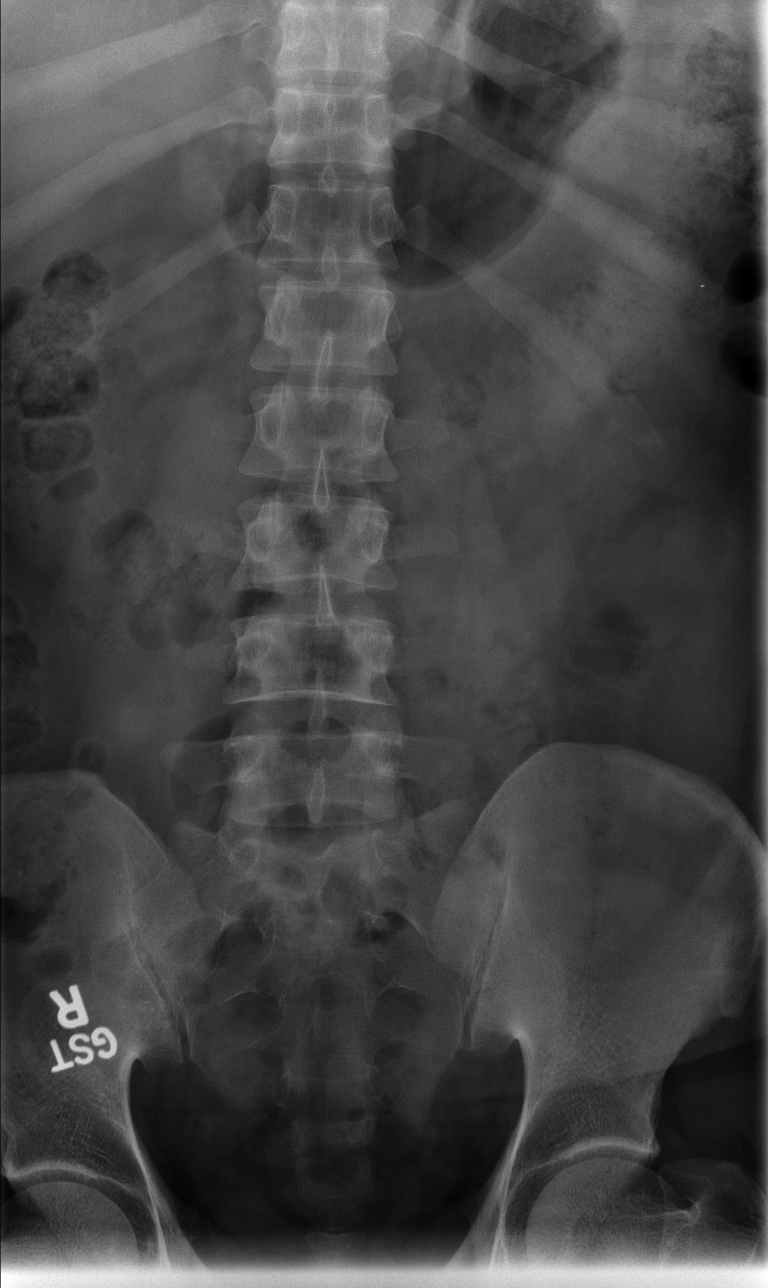

[t lumbar spine obl (1 of 2)]
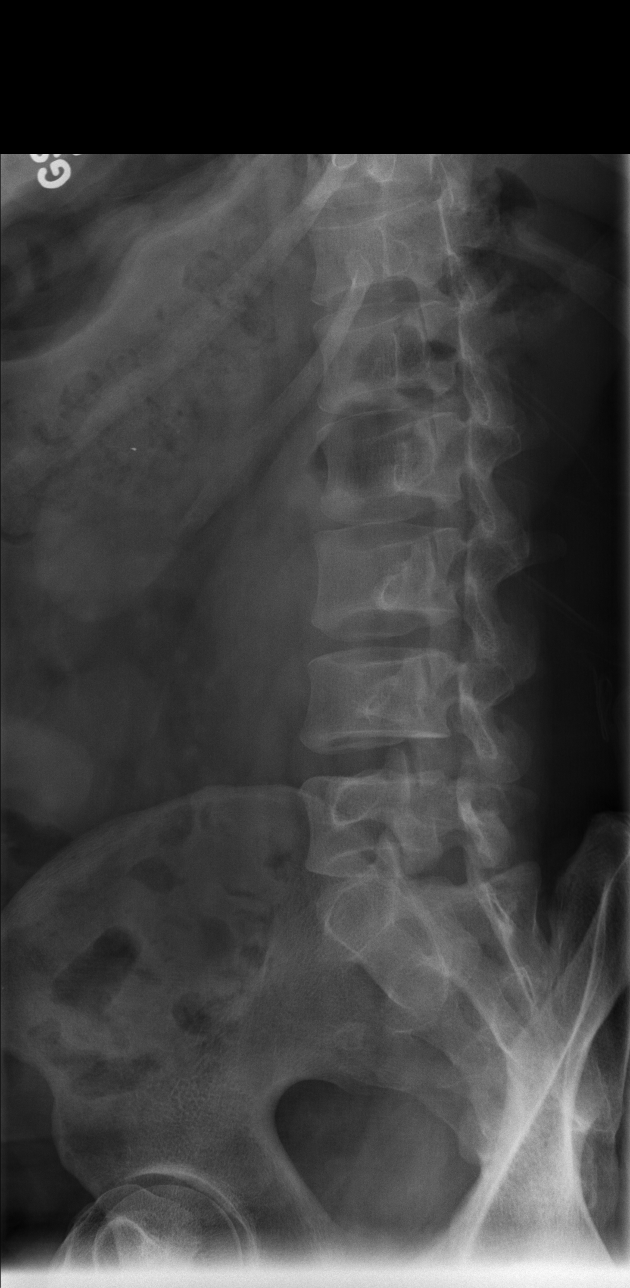

[t lumbar spine obl (2 of 2)]
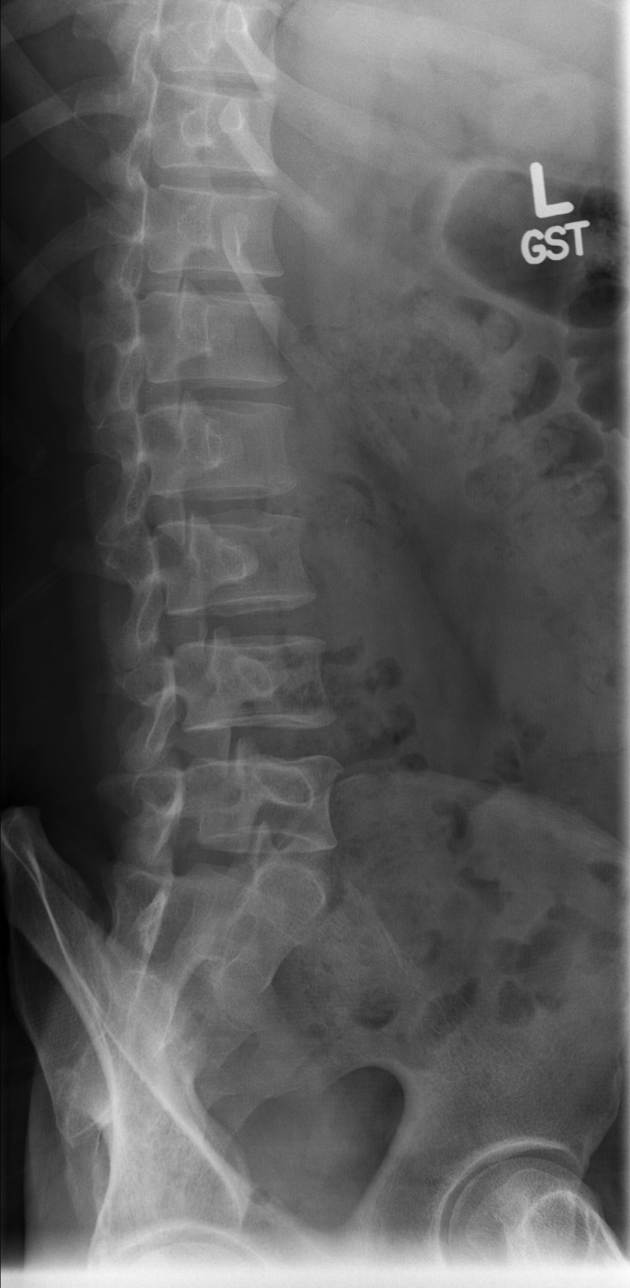

[t lumbar spine lat]
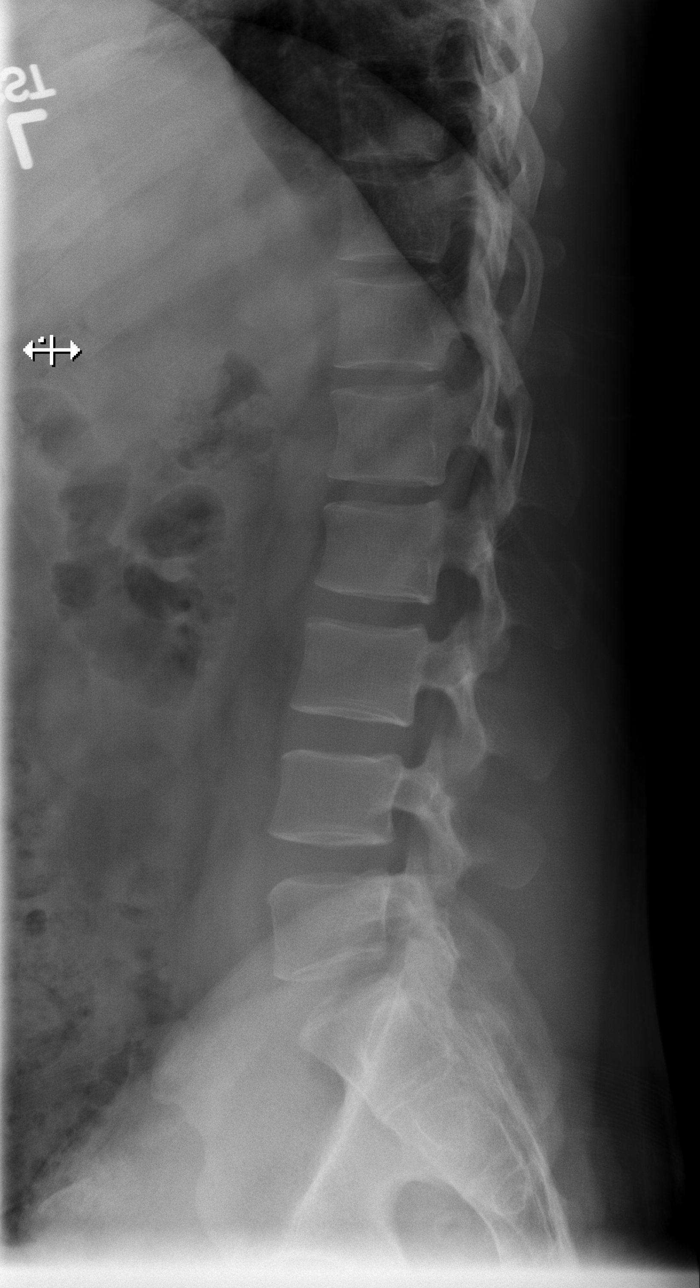

[t lumbar l-5 s-1 spot]
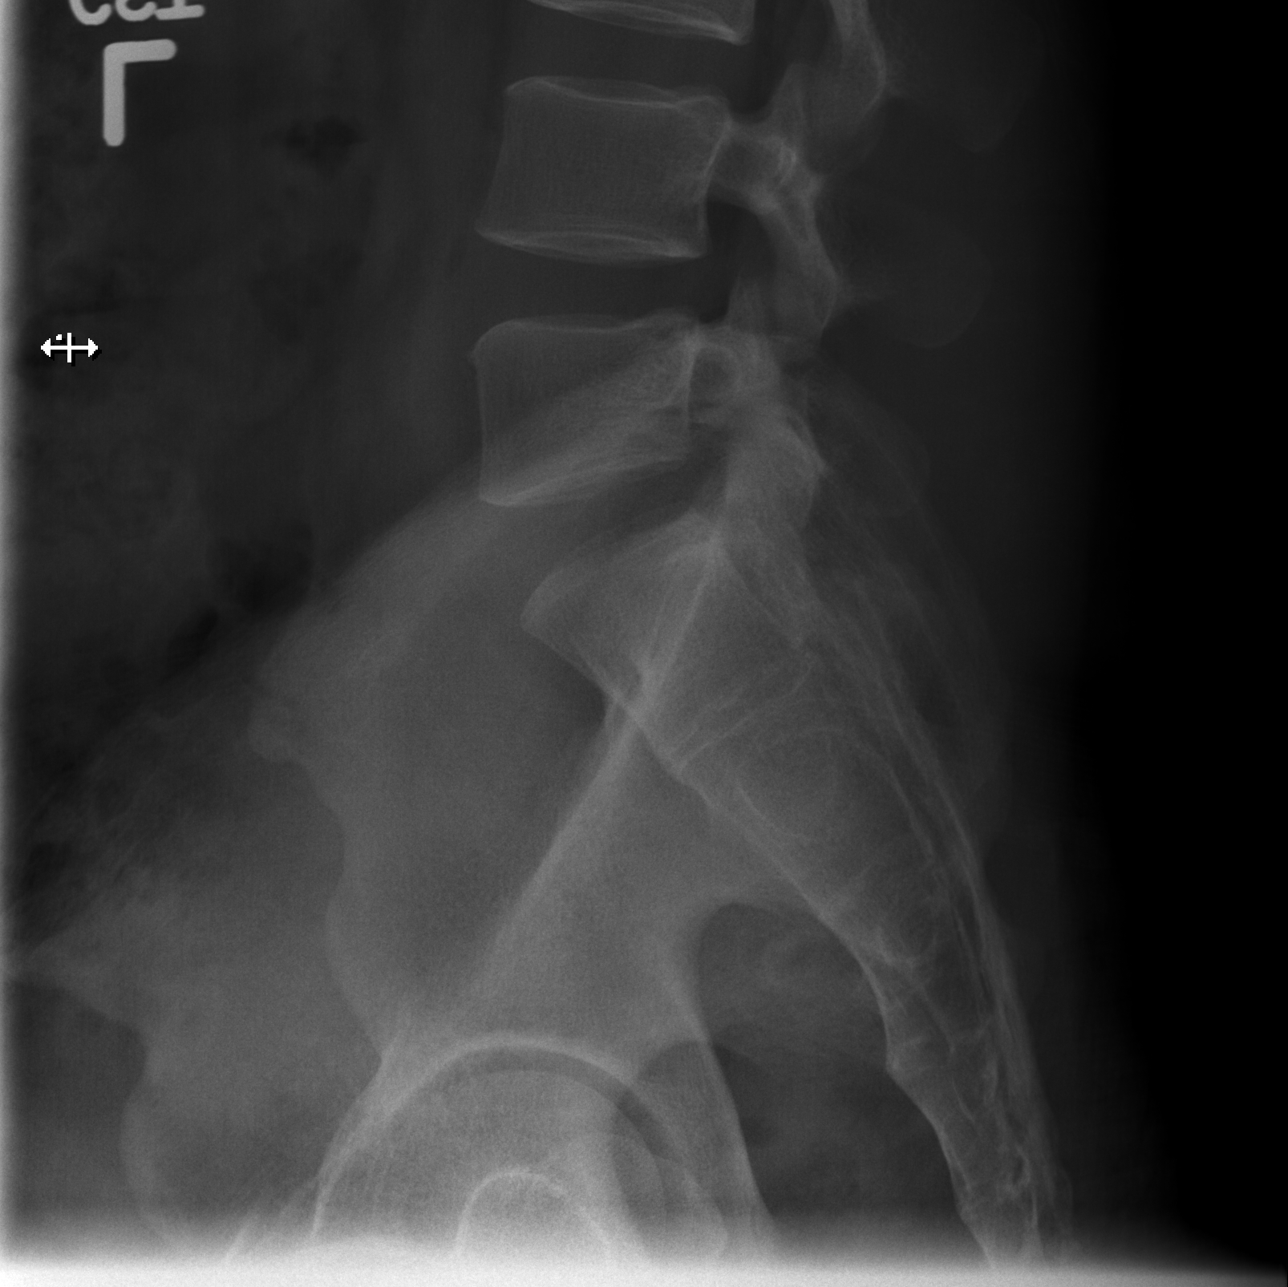

[5 of 5 positions shown; findings below may reference images not displayed]

FINDINGS: There is no evidence of lumbar spine fracture. Alignment is normal.
Intervertebral disc spaces are maintained.
IMPRESSION: Negative.

## 2022-06-14 DIAGNOSIS — Z13220 Encounter for screening for lipoid disorders: Secondary | ICD-10-CM | POA: Diagnosis not present

## 2022-06-14 DIAGNOSIS — Z131 Encounter for screening for diabetes mellitus: Secondary | ICD-10-CM | POA: Diagnosis not present

## 2022-07-06 DIAGNOSIS — Z419 Encounter for procedure for purposes other than remedying health state, unspecified: Secondary | ICD-10-CM | POA: Diagnosis not present

## 2022-08-04 DIAGNOSIS — Z419 Encounter for procedure for purposes other than remedying health state, unspecified: Secondary | ICD-10-CM | POA: Diagnosis not present

## 2022-09-04 DIAGNOSIS — Z419 Encounter for procedure for purposes other than remedying health state, unspecified: Secondary | ICD-10-CM | POA: Diagnosis not present

## 2022-09-07 DIAGNOSIS — Z0000 Encounter for general adult medical examination without abnormal findings: Secondary | ICD-10-CM | POA: Diagnosis not present

## 2022-09-07 DIAGNOSIS — E781 Pure hyperglyceridemia: Secondary | ICD-10-CM | POA: Diagnosis not present

## 2022-09-07 DIAGNOSIS — E669 Obesity, unspecified: Secondary | ICD-10-CM | POA: Diagnosis not present

## 2022-10-04 DIAGNOSIS — Z419 Encounter for procedure for purposes other than remedying health state, unspecified: Secondary | ICD-10-CM | POA: Diagnosis not present

## 2022-11-04 DIAGNOSIS — Z419 Encounter for procedure for purposes other than remedying health state, unspecified: Secondary | ICD-10-CM | POA: Diagnosis not present

## 2022-12-04 DIAGNOSIS — Z419 Encounter for procedure for purposes other than remedying health state, unspecified: Secondary | ICD-10-CM | POA: Diagnosis not present

## 2023-01-04 DIAGNOSIS — Z419 Encounter for procedure for purposes other than remedying health state, unspecified: Secondary | ICD-10-CM | POA: Diagnosis not present

## 2023-02-04 DIAGNOSIS — Z419 Encounter for procedure for purposes other than remedying health state, unspecified: Secondary | ICD-10-CM | POA: Diagnosis not present

## 2023-03-06 DIAGNOSIS — Z419 Encounter for procedure for purposes other than remedying health state, unspecified: Secondary | ICD-10-CM | POA: Diagnosis not present

## 2023-04-06 DIAGNOSIS — Z419 Encounter for procedure for purposes other than remedying health state, unspecified: Secondary | ICD-10-CM | POA: Diagnosis not present

## 2023-04-24 DIAGNOSIS — Z23 Encounter for immunization: Secondary | ICD-10-CM | POA: Diagnosis not present

## 2023-04-24 DIAGNOSIS — Z20822 Contact with and (suspected) exposure to covid-19: Secondary | ICD-10-CM | POA: Diagnosis not present

## 2023-04-24 DIAGNOSIS — R059 Cough, unspecified: Secondary | ICD-10-CM | POA: Diagnosis not present

## 2023-05-06 DIAGNOSIS — Z419 Encounter for procedure for purposes other than remedying health state, unspecified: Secondary | ICD-10-CM | POA: Diagnosis not present

## 2023-06-06 DIAGNOSIS — Z419 Encounter for procedure for purposes other than remedying health state, unspecified: Secondary | ICD-10-CM | POA: Diagnosis not present

## 2023-06-11 DIAGNOSIS — R7611 Nonspecific reaction to tuberculin skin test without active tuberculosis: Secondary | ICD-10-CM | POA: Diagnosis not present

## 2023-07-07 DIAGNOSIS — Z419 Encounter for procedure for purposes other than remedying health state, unspecified: Secondary | ICD-10-CM | POA: Diagnosis not present

## 2023-08-04 DIAGNOSIS — Z419 Encounter for procedure for purposes other than remedying health state, unspecified: Secondary | ICD-10-CM | POA: Diagnosis not present

## 2023-09-15 DIAGNOSIS — Z419 Encounter for procedure for purposes other than remedying health state, unspecified: Secondary | ICD-10-CM | POA: Diagnosis not present

## 2023-09-26 DIAGNOSIS — W1830XA Fall on same level, unspecified, initial encounter: Secondary | ICD-10-CM | POA: Diagnosis not present

## 2023-09-26 DIAGNOSIS — S62632A Displaced fracture of distal phalanx of right middle finger, initial encounter for closed fracture: Secondary | ICD-10-CM | POA: Diagnosis not present

## 2023-09-26 DIAGNOSIS — M79644 Pain in right finger(s): Secondary | ICD-10-CM | POA: Diagnosis not present

## 2023-10-15 DIAGNOSIS — Z419 Encounter for procedure for purposes other than remedying health state, unspecified: Secondary | ICD-10-CM | POA: Diagnosis not present

## 2023-10-30 DIAGNOSIS — B359 Dermatophytosis, unspecified: Secondary | ICD-10-CM | POA: Diagnosis not present

## 2023-10-30 DIAGNOSIS — E781 Pure hyperglyceridemia: Secondary | ICD-10-CM | POA: Diagnosis not present

## 2023-10-30 DIAGNOSIS — Z0000 Encounter for general adult medical examination without abnormal findings: Secondary | ICD-10-CM | POA: Diagnosis not present

## 2023-10-30 DIAGNOSIS — E663 Overweight: Secondary | ICD-10-CM | POA: Diagnosis not present

## 2023-10-30 DIAGNOSIS — R7303 Prediabetes: Secondary | ICD-10-CM | POA: Diagnosis not present

## 2023-11-15 DIAGNOSIS — Z419 Encounter for procedure for purposes other than remedying health state, unspecified: Secondary | ICD-10-CM | POA: Diagnosis not present

## 2023-12-15 DIAGNOSIS — Z419 Encounter for procedure for purposes other than remedying health state, unspecified: Secondary | ICD-10-CM | POA: Diagnosis not present

## 2024-01-15 DIAGNOSIS — Z419 Encounter for procedure for purposes other than remedying health state, unspecified: Secondary | ICD-10-CM | POA: Diagnosis not present

## 2024-02-15 DIAGNOSIS — Z419 Encounter for procedure for purposes other than remedying health state, unspecified: Secondary | ICD-10-CM | POA: Diagnosis not present

## 2024-03-31 DIAGNOSIS — S91332A Puncture wound without foreign body, left foot, initial encounter: Secondary | ICD-10-CM | POA: Diagnosis not present

## 2024-03-31 DIAGNOSIS — Z23 Encounter for immunization: Secondary | ICD-10-CM | POA: Diagnosis not present
